# Patient Record
Sex: Male | Born: 1950 | Race: White | Hispanic: No | Marital: Married | Smoking: Never smoker
Health system: Southern US, Community
[De-identification: ages and names within clinical notes are randomized; demographics above are authoritative.]

## PROBLEM LIST (undated history)

## (undated) DIAGNOSIS — H409 Unspecified glaucoma: Secondary | ICD-10-CM

## (undated) HISTORY — DX: Unspecified glaucoma: H40.9

## (undated) HISTORY — PX: VASECTOMY: SHX75

---

## 1994-08-12 HISTORY — PX: HERNIA REPAIR: SHX51

## 1995-08-13 HISTORY — PX: DG CALCANEOUS HEEL LEFT (ARMC HX): HXRAD1465

## 2000-08-12 HISTORY — PX: BRAIN SURGERY: SHX531

## 2016-12-19 ENCOUNTER — Telehealth: Payer: Self-pay | Admitting: General Practice

## 2016-12-19 DIAGNOSIS — M7121 Synovial cyst of popliteal space [Baker], right knee: Secondary | ICD-10-CM

## 2016-12-19 DIAGNOSIS — M25561 Pain in right knee: Secondary | ICD-10-CM

## 2016-12-19 NOTE — Telephone Encounter (Signed)
Relation to pt: self  Call back [phone] Pharmacy:  Reason for call:  Patient scheduled new patient appointment with Ramon DredgeEdward for 3:30p 12/20/16 45 minute time slot, patient would like to discuss slight fluid in leg, please advise

## 2016-12-20 ENCOUNTER — Ambulatory Visit (HOSPITAL_BASED_OUTPATIENT_CLINIC_OR_DEPARTMENT_OTHER)
Admission: RE | Admit: 2016-12-20 | Discharge: 2016-12-20 | Disposition: A | Discharge status: Home / Self Care | Payer: BLUE CROSS/BLUE SHIELD | Source: Ambulatory Visit | Attending: Medical | Admitting: Medical

## 2016-12-20 ENCOUNTER — Ambulatory Visit (INDEPENDENT_AMBULATORY_CARE_PROVIDER_SITE_OTHER): Payer: BLUE CROSS/BLUE SHIELD | Admitting: Medical

## 2016-12-20 ENCOUNTER — Encounter: Payer: Self-pay | Admitting: Medical

## 2016-12-20 VITALS — BP 110/78 | HR 78 | Temp 98.3°F | Resp 16 | Ht 69.0 in | Wt 182.0 lb

## 2016-12-20 DIAGNOSIS — M25561 Pain in right knee: Secondary | ICD-10-CM

## 2016-12-20 DIAGNOSIS — Z1211 Encounter for screening for malignant neoplasm of colon: Secondary | ICD-10-CM | POA: Diagnosis not present

## 2016-12-20 DIAGNOSIS — M7989 Other specified soft tissue disorders: Secondary | ICD-10-CM | POA: Diagnosis not present

## 2016-12-20 DIAGNOSIS — M25461 Effusion, right knee: Secondary | ICD-10-CM | POA: Diagnosis not present

## 2016-12-20 DIAGNOSIS — M7121 Synovial cyst of popliteal space [Baker], right knee: Secondary | ICD-10-CM | POA: Diagnosis not present

## 2016-12-20 DIAGNOSIS — M79604 Pain in right leg: Secondary | ICD-10-CM | POA: Diagnosis not present

## 2016-12-20 MED ORDER — DICLOFENAC SODIUM 75 MG PO TBEC: 75.0000 mg | DELAYED_RELEASE_TABLET | Freq: Two times a day (BID) | ORAL | 0 refills | Status: AC

## 2016-12-20 NOTE — Patient Instructions (Signed)
For your rt knee pain will get xray of your knee. Rx diclofenac for pain. Stop alleve otc.  For rt leg pain and swelling will get stat rt lower ext us.  Refer to GI for colonoscopy.  Call TehalehJennifer in one week if she has not called you to get name of  GI.  Decrease alcohol use.   Schedule cpe in 6-8 weeks.

## 2016-12-20 NOTE — Telephone Encounter (Signed)
referal to ortho placed. 

## 2016-12-20 NOTE — Progress Notes (Signed)
Subjective:    Patient ID: Jared Wiley, male    DOB: Feb 17, 1951, 66 y.o.   MRN: [id number]  HPI  Pt in states first he is fairly healthy.   I have reviewed pt PMH, PSH, FH, Social History and Surgical History  Pt formerly worked [employer] representative, Psychologist, forensic,    Dad- passed away at 68 year old from colon. Pt has not had colonoscopy.  Pt recently had rt knee pain/ distal thigh has been swelling for about 6-8 days. Pt had pain in his knee. No trauma or fall. But pt states over years some occasional knee pain and swelling but no constant. 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 Pt recently using naprosyn otc.   Pt also has neck pain and lower back for about 2 weeks. Pt has been seeing chiropractor. He state both area are much better. Only faint pain now. Level one in both areas.  Pt had some blood work maybe drawn 3-4 days ago at [facility] Pt went to urgent care. Pt has some concern about dvt.  Hx of traumatic brain injury years ago.     Review of Systems  Constitutional: Negative for chills, fatigue and fever.  Respiratory: Negative for cough, chest tightness, shortness of breath and wheezing.   Cardiovascular: Negative for chest pain and palpitations.  Gastrointestinal: Negative for abdominal pain, constipation, diarrhea, nausea and vomiting.  Musculoskeletal: Negative for back pain and gait problem.       Leg pain. Knee pain.   Neck pain and lower back pain is improving.  Skin: Negative for rash.  Neurological: Negative for dizziness, seizures, weakness, numbness and headaches.  Hematological: Negative for adenopathy. Does not bruise/bleed easily.  Psychiatric/Behavioral: Negative for behavioral problems and confusion.     Past Medical History:  Diagnosis Date  . Arthritis   . Glaucoma      Social History   Social History  . Marital status: Married    Spouse name: N/A  . Number of children: N/A  . Years of education: N/A   Occupational History  . Not  on file.   Social History Main Topics  . Smoking status: Never Smoker  . Smokeless tobacco: Never Used  . Alcohol use 10.8 oz/week    18 Cans of beer per week     Comment: About 6 beers 3-4 times a week. more than 10 years.  . Drug use: No  . Sexual activity: No   Other Topics Concern  . Not on file   Social History Narrative  . No narrative on file    Past Surgical History:  Procedure Laterality Date  . DG CALCANEOUS HEEL LEFT ([facility] HX)  1997  . HERNIA REPAIR  1996    Family History  Problem Relation Age of Onset  . Hyperlipidemia Mother   . Osteoporosis Mother   . Colon cancer Father     Not on File  No current outpatient prescriptions on file prior to visit.   No current facility-administered medications on file prior to visit.     BP 110/78 (BP Location: Left Arm, Patient Position: Sitting, Cuff Size: Normal)   Pulse 78   Temp 98.3 F (36.8 C) (Oral)   Resp 16   Ht 5\' 9"  (1.753 m)   Wt 182 lb (82.6 kg)   SpO2 95%   BMI 26.88 kg/m       Objective:   Physical Exam  General Mental Status- Alert. General Appearance- Not in acute distress.   Skin General:  Color- Normal Color. Moisture- Normal Moisture.  Neck Carotid Arteries- Normal color. Moisture- Normal Moisture. No carotid bruits. No JVD.  Chest and Lung Exam Auscultation: Breath Sounds:-Normal.  Cardiovascular Auscultation:Rythm- Regular. Murmurs & Other Heart Sounds:Auscultation of the heart reveals- No Murmurs.  Abdomen Inspection:-Inspeection Normal. Palpation/Percussion:Note:No mass. Palpation and Percussion of the abdomen reveal- Non Tender, Non Distended + BS, no rebound or guarding.  Neurologic Cranial Nerve exam:- CN III-XII intact(No nystagmus), symmetric smile. Strength:- 5/5 equal and symmetric strength both upper and lower extremities.  Rt knee- mild swelling and faint pain on range of motion. No warmth and not induration. Swelling is mild from distal rt thigh down to mid  pretibia area. Swelling of rt tibia area about 1+.     Assessment & Plan:  For your rt knee pain will get xray of your knee. Rx diclofenac for pain. Stop alleve otc.  For rt leg pain and swelling will get stat rt lower ext us.  Refer to GI for colonoscopy.  Call DorchesterJennifer in one week if she has not called you to get name of  GI.  Decrease alcohol use.   Schedule cpe in 6-8 weeks.

## 2016-12-24 ENCOUNTER — Ambulatory Visit (INDEPENDENT_AMBULATORY_CARE_PROVIDER_SITE_OTHER): Payer: BLUE CROSS/BLUE SHIELD | Admitting: Orthopaedic Surgery

## 2016-12-24 ENCOUNTER — Encounter (INDEPENDENT_AMBULATORY_CARE_PROVIDER_SITE_OTHER): Payer: Self-pay | Admitting: Orthopaedic Surgery

## 2016-12-24 VITALS — BP 118/60 | HR 83 | Resp 13 | Ht 69.0 in | Wt 182.0 lb

## 2016-12-24 DIAGNOSIS — M25561 Pain in right knee: Secondary | ICD-10-CM

## 2016-12-24 MED ORDER — METHYLPREDNISOLONE ACETATE 40 MG/ML IJ SUSP
80.0000 mg | INTRAMUSCULAR | Status: AC | PRN
  Administered 2016-12-24: 80 mg

## 2016-12-24 MED ORDER — LIDOCAINE HCL 1 % IJ SOLN
5.0000 mL | INTRAMUSCULAR | Status: AC | PRN
  Administered 2016-12-24: 5 mL

## 2016-12-24 MED ORDER — BUPIVACAINE HCL 0.5 % IJ SOLN
3.0000 mL | INTRAMUSCULAR | Status: AC | PRN
  Administered 2016-12-24: 3 mL via INTRA_ARTICULAR

## 2016-12-24 NOTE — Progress Notes (Signed)
Office Visit Note   Patient: Jared Wiley           Date of Birth: 08/22/1950           MRN: [id number] Visit Date: 12/24/2016              Requested by: Esperanza Richters, PA-C [address] [place], [place] [address] PCP: Esperanza Richters, PA-C   Assessment & Plan: Visit Diagnoses:  1. 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 Swelling in right calf and ankle possibly related to a leaking popliteal cyst. Doppler ultrasound was negative for DVT.  Plan: Cortisone injection right knee. Return to office in 2 weeks or sooner for reevaluation  Follow-Up Instructions: Return in about 2 weeks (around 01/07/2017).   Orders:  No orders of the defined types were placed in this encounter.  No orders of the defined types were placed in this encounter.     Procedures: Large Joint Inj Date/Time: 12/24/2016 6:12 PM Performed by: Valeria Batman Authorized by: Valeria Batman   Consent Given by:  Patient Timeout: prior to procedure the correct patient, procedure, and site was verified   Indications:  Pain and joint swelling Location:  Knee Site:  R knee Prep: patient was prepped and draped in usual sterile fashion   Needle Size:  25 G Needle Length:  1.5 inches Approach:  Anteromedial Ultrasound Guidance: No   Fluoroscopic Guidance: No   Arthrogram: No   Medications:  5 mL lidocaine 1 %; 80 mg methylPREDNISolone acetate 40 MG/ML; 3 mL bupivacaine 0.5 % Aspiration Attempted: No   Patient tolerance:  Patient tolerated the procedure well with no immediate complications     Clinical Data: No additional findings.   Subjective: Chief Complaint  Patient presents with  . Right Knee - Pain    Right knee pain x 9-12 days, Baker's Cyst, swelling at times, difficulty bending, difficulty walking, difficulty bearing weight, difficulty sleeping at night, x-rays at Dr. Esperanza Richters [facility], pain moves from anterior to posterior, no surgery to  knee,  . Knee Pain    Right knee pain x 9-12 days, Baker's Cyst, swelling at times, difficulty bending, difficulty walking, difficulty bearing weight, difficulty sleeping at night, x-rays at Dr. Esperanza Richters [facility], pain moves from anterior to posterior, no surgery to knee, not diabetic, IBU helps  Mr. Jared Wiley was seen by his primary care physician late last week. A Doppler ultrasound was negative for DVT. He was noted to have a small Baker's cyst in the popliteal space. He also had films of his right knee there were negative for any acute changes but with some evidence of mild osteoarthritis particularly in the medial compartment. He's been experiencing pain for the last 10 or so days as mentioned above associated with swelling of his leg from the knee to the ankle. Denies numbness or tingling. Denies any thigh or calf or back pain. She tried several over-the-counter medicines with only temporary relief of his pain  HPI  Review of Systems   Objective: Vital Signs: BP 118/60 (BP Location: Left Arm, Patient Position: Sitting, Cuff Size: Normal)   Pulse 83   Resp 13   Ht 5\' 9"  (1.753 m)   Wt 182 lb (82.6 kg)   BMI 26.88 kg/m   Physical Exam  Ortho Exam examination the right lower extremity reveals negative straight leg raise. No pain range of motion right hip. Very minimal effusion right  knee with minimal medial joint pain. No patellar discomfort. Full extension. Over 105 of flexion without instability. Some mild popliteal fullness. Painless swelling of his right leg from the knee to the ankle. No specific calf pain. Swelling is not pitting.   No specialty comments available.  Imaging: No results found.   PMFS History: There are no active problems to display for this patient.  Past Medical History:  Diagnosis Date  . Glaucoma     Family History  Problem Relation Age of Onset  . Hyperlipidemia Mother   . Osteoporosis Mother   . Colon cancer Father   . Cancer Brother       Past Surgical History:  Procedure Laterality Date  . BRAIN SURGERY  2002  . DG CALCANEOUS HEEL LEFT ([facility] HX)  1997  . HERNIA REPAIR  1996   Social History   Occupational History  . Not on file.   Social History Main Topics  . Smoking status: Never Smoker  . Smokeless tobacco: Never Used  . Alcohol use 10.8 oz/week    18 Cans of beer per week     Comment: About 6 beers 3-4 times a week. more than 10 years.  . Drug use: No  . Sexual activity: No     Valeria BatmanPeter W Keilyn Haggard, MD   Note - This record has been created using [employer] software.  Chart creation errors have been sought, but may not always  have been located. Such creation errors do not reflect on  the standard of medical care.

## 2017-01-07 ENCOUNTER — Encounter (INDEPENDENT_AMBULATORY_CARE_PROVIDER_SITE_OTHER): Payer: Self-pay | Admitting: Orthopaedic Surgery

## 2017-01-07 ENCOUNTER — Ambulatory Visit (INDEPENDENT_AMBULATORY_CARE_PROVIDER_SITE_OTHER): Payer: BLUE CROSS/BLUE SHIELD | Admitting: Orthopaedic Surgery

## 2017-01-07 VITALS — BP 111/71 | HR 78 | Resp 14 | Ht 69.0 in | Wt 182.0 lb

## 2017-01-07 DIAGNOSIS — G8929 Other chronic pain: Secondary | ICD-10-CM

## 2017-01-07 DIAGNOSIS — M25561 Pain in right knee: Secondary | ICD-10-CM | POA: Diagnosis not present

## 2017-01-07 NOTE — Progress Notes (Signed)
   Office Visit Note   Patient: Jared Wiley           Date of Birth: 08/07/1951           MRN: [id number] Visit Date: 01/07/2017              Requested by: Esperanza RichtersSaguier, Edward, PA-C [address] [place], [place] [id number] PCP: Esperanza RichtersSaguier, Edward, PA-C   Assessment & Plan: Visit Diagnoses:  1. Chronic pain of right knee   Much better after cortisone injection pain 6580f on a scale of 10  Plan: Office follow up as needed. Next up would be to obtain an MRI scan of his right knee with the possibility of medial meniscal tear  Follow-Up Instructions: Return if symptoms worsen or fail to improve.   Orders:  No orders of the defined types were placed in this encounter.  No orders of the defined types were placed in this encounter.     Procedures: No procedures performed   Clinical Data: No additional findings.   Subjective: Chief Complaint  Patient presents with  . Right Knee - Follow-up, Pain  . Knee Pain    Having some pain level 1, coritisone inj. helped, walking cautiously, less flexibility, limited weight bearing, difficulty with steps, little swelling, popped today in parking lot  Jared Wiley relates that his knee is better since he had cortisone injection. Still having some discomfort but not to the point where it's functionally limiting. He might have a very minimal effusion. He avoid some activities but notes at that's not a problem.  HPI  Review of Systems   Objective: Vital Signs: BP 111/71 (BP Location: Right Arm, Patient Position: Sitting, Cuff Size: Normal)   Pulse 78   Resp 14   Ht 5\' 9"  (1.753 m)   Wt 182 lb (82.6 kg)   BMI 26.88 kg/m   Physical Exam  Ortho Exam right knee with some discomfort along the posterior medial joint line. No popping or clicking. No effusion. Full extension and flexion comparable to the opposite side. No instability. No lateral joint pain.  No specialty comments available.  Imaging: No results found.   PMFS  History: There are no active problems to display for this patient.  Past Medical History:  Diagnosis Date  . Glaucoma     Family History  Problem Relation Age of Onset  . Hyperlipidemia Mother   . Osteoporosis Mother   . Colon cancer Father   . Cancer Brother     Past Surgical History:  Procedure Laterality Date  . BRAIN SURGERY  2002  . DG CALCANEOUS HEEL LEFT ([facility] HX)  1997  . HERNIA REPAIR  1996  . VASECTOMY     Social History   Occupational History  . Not on file.   Social History Main Topics  . Smoking status: Never Smoker  . Smokeless tobacco: Never Used  . Alcohol use 10.8 oz/week    18 Cans of beer per week     Comment: About 6 beers 3-4 times a week. more than 10 years.  . Drug use: No  . Sexual activity: No     Valeria BatmanPeter W Zanyah Lentsch, MD   Note - This record has been created using [employer] software.  Chart creation errors have been sought, but may not always  have been located. Such creation errors do not reflect on  the standard of medical care.

## 2017-01-10 ENCOUNTER — Ambulatory Visit (INDEPENDENT_AMBULATORY_CARE_PROVIDER_SITE_OTHER): Payer: BLUE CROSS/BLUE SHIELD | Admitting: Orthopaedic Surgery

## 2017-01-22 ENCOUNTER — Encounter: Payer: Self-pay | Admitting: Medical

## 2018-11-16 IMAGING — DX DG KNEE 1-2V*R*
2 series · 2 of 2 positions shown · non-contrast
Comparison: None.

CLINICAL DATA: Acute right knee pain and swelling for 6 days.

EXAM:
RIGHT KNEE - 1-2 VIEW

[knee ap]
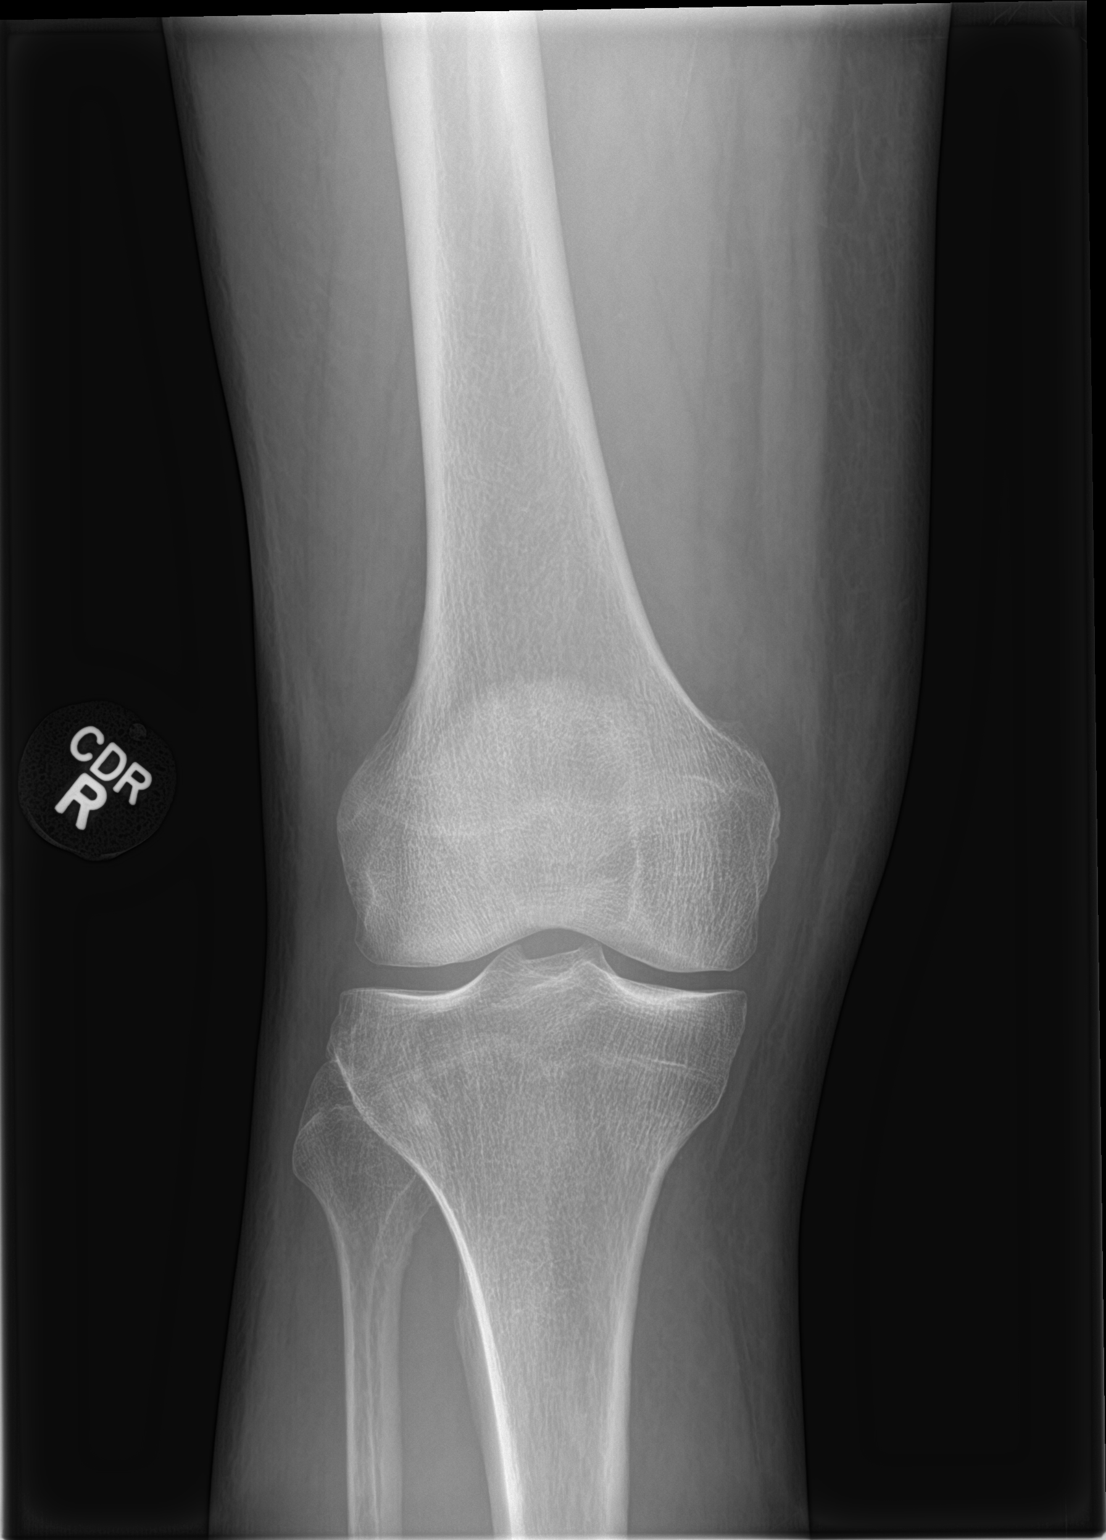

[knee lat]
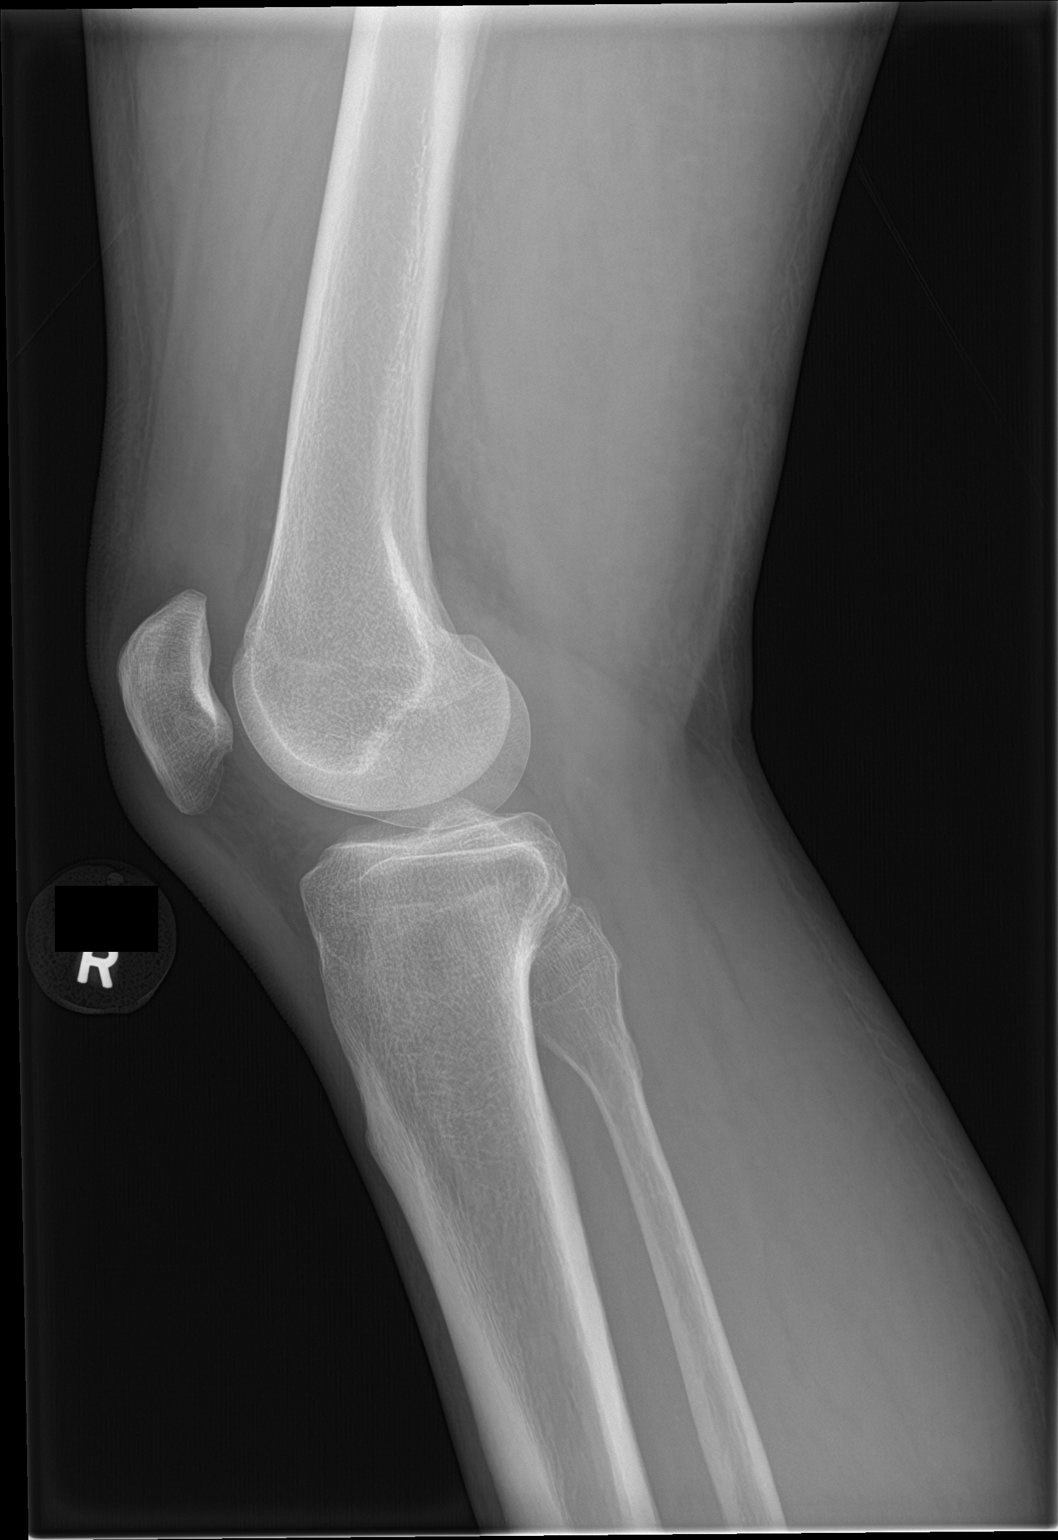

[2 of 2 positions shown; findings below may reference images not displayed]

FINDINGS: No fracture or dislocation. Possible minimal medial tibiofemoral
joint space narrowing. No bony destructive change or osseous
erosions. There is a moderate joint effusion. Mild anterior soft
tissue edema.
IMPRESSION: Joint effusion.  No acute osseous abnormality.

Possible minimal medial tibiofemoral joint space narrowing.
